# Patient Record
Sex: Female | Born: 1955 | Race: White | Hispanic: No | Marital: Married | Smoking: Never smoker
Health system: Southern US, Community
[De-identification: ages and names within clinical notes are randomized; demographics above are authoritative.]

## PROBLEM LIST (undated history)

## (undated) DIAGNOSIS — IMO0002 Reserved for concepts with insufficient information to code with codable children: Secondary | ICD-10-CM

## (undated) DIAGNOSIS — Z5189 Encounter for other specified aftercare: Secondary | ICD-10-CM

## (undated) DIAGNOSIS — M1990 Unspecified osteoarthritis, unspecified site: Secondary | ICD-10-CM

## (undated) DIAGNOSIS — I10 Essential (primary) hypertension: Secondary | ICD-10-CM

## (undated) DIAGNOSIS — E119 Type 2 diabetes mellitus without complications: Secondary | ICD-10-CM

## (undated) HISTORY — DX: Encounter for other specified aftercare: Z51.89

## (undated) HISTORY — DX: Reserved for concepts with insufficient information to code with codable children: IMO0002

## (undated) HISTORY — DX: Type 2 diabetes mellitus without complications: E11.9

## (undated) HISTORY — DX: Unspecified osteoarthritis, unspecified site: M19.90

## (undated) HISTORY — DX: Essential (primary) hypertension: I10

---

## 2014-02-03 ENCOUNTER — Encounter: Payer: Self-pay | Admitting: Podiatrist

## 2014-02-03 ENCOUNTER — Ambulatory Visit (INDEPENDENT_AMBULATORY_CARE_PROVIDER_SITE_OTHER): Payer: BC Managed Care – PPO | Admitting: Podiatrist

## 2014-02-03 VITALS — BP 117/69 | HR 66 | Resp 18

## 2014-02-03 DIAGNOSIS — L600 Ingrowing nail: Secondary | ICD-10-CM

## 2014-02-03 NOTE — Progress Notes (Signed)
   Subjective:    Patient ID: Hailey FilbertJudy Beauchesne, female    DOB: 01-21-1956, 58 y.o.   MRN: [id number]  HPI 2nd toe on my left foot has been going on for about 2 months and the ingrown has been going on for a year and doesn't hurt all the time and the sore did hurt when I did it and no draining and can be sore and tender and hurts with closed toe shoes    Review of Systems  Hematological: Bruises/bleeds easily.  All other systems reviewed and are negative.      Objective:   Physical Exam  Patient is awake, alert, and oriented x 3.  In no acute distress.  Vascular status is intact with palpable pedal pulses at 2/4 DP and PT bilateral and capillary refill time within normal limits. Neurological sensation is also intact bilaterally via Semmes Weinstein monofilament at 5/5 sites. Light touch, vibratory sensation, Achilles tendon reflex is intact. Dermatological exam reveals skin color, turger and texture as normal. No open lesions present.  Musculature intact with dorsiflexion, plantarflexion, inversion, eversion.  Left 2nd toe has an area of dried hemorrhagic tissue at the distal lateral tip-- non infected and not draining.  The lateral side of the left 2nd toenail is ingrown and causing hypertrophy of the lateral nail fold.  No redness, swelling or drainage is present but pain is noted subjectively.    Assessment & Plan:  Ingrown left 2nd toenail lateral side, hematoma tip of left 2nd toe  Plan:Treatment options and alternatives discussed.  Recommended permanent phenol matrixectomy and patient agreed.  Left 2nd toe was prepped with alcohol and a 1 to 1 mix of 0.5% marcaine plain and 2% lidocaine plain was administered in a digital block fashion.  The toe was then prepped with betadine solution and exsanguinated.  The lateral nail border was then excised and matrix tissue exposed.  Phenol was then applied to the matrix tissue followed by an alcohol wash.  Antibiotic ointment and a dry sterile dressing  was applied.  The patient was dispensed instructions for aftercare.

## 2014-02-03 NOTE — Patient Instructions (Signed)

## 2014-04-21 ENCOUNTER — Ambulatory Visit: Payer: BC Managed Care – PPO | Admitting: Podiatrist

## 2014-07-07 ENCOUNTER — Ambulatory Visit (INDEPENDENT_AMBULATORY_CARE_PROVIDER_SITE_OTHER): Payer: BC Managed Care – PPO | Admitting: Podiatrist

## 2014-07-07 ENCOUNTER — Ambulatory Visit (INDEPENDENT_AMBULATORY_CARE_PROVIDER_SITE_OTHER): Payer: BC Managed Care – PPO

## 2014-07-07 DIAGNOSIS — R52 Pain, unspecified: Secondary | ICD-10-CM

## 2014-07-07 DIAGNOSIS — S93601A Unspecified sprain of right foot, initial encounter: Secondary | ICD-10-CM

## 2014-07-07 MED ORDER — PREDNISONE 10 MG PO KIT: PACK | ORAL | Status: DC

## 2014-07-07 NOTE — Patient Instructions (Signed)
Foot Sprain The muscles and cord like structures which attach muscle to bone (tendons) that surround the feet are made up of units. A foot sprain can occur at the weakest spot in any of these units. This condition is most often caused by injury to or overuse of the foot, as from playing contact sports, or aggravating a previous injury, or from poor conditioning, or obesity. SYMPTOMS  Pain with movement of the foot.  Tenderness and swelling at the injury site.  Loss of strength is present in moderate or severe sprains. THE THREE GRADES OR SEVERITY OF FOOT SPRAIN ARE:  Mild (Grade I): Slightly pulled muscle without tearing of muscle or tendon fibers or loss of strength.  Moderate (Grade II): Tearing of fibers in a muscle, tendon, or at the attachment to bone, with small decrease in strength.  Severe (Grade III): Rupture of the muscle-tendon-bone attachment, with separation of fibers. Severe sprain requires surgical repair. Often repeating (chronic) sprains are caused by overuse. Sudden (acute) sprains are caused by direct injury or over-use. DIAGNOSIS  Diagnosis of this condition is usually by your own observation. If problems continue, a caregiver may be required for further evaluation and treatment. X-rays may be required to make sure there are not breaks in the bones (fractures) present. Continued problems may require physical therapy for treatment. PREVENTION  Use strength and conditioning exercises appropriate for your sport.  Warm up properly prior to working out.  Use athletic shoes that are made for the sport you are participating in.  Allow adequate time for healing. Early return to activities makes repeat injury more likely, and can lead to an unstable arthritic foot that can result in prolonged disability. Mild sprains generally heal in 3 to 10 days, with moderate and severe sprains taking 2 to 10 weeks. Your caregiver can help you determine the proper time required for  healing. HOME CARE INSTRUCTIONS   Apply ice to the injury for 15-20 minutes, 03-04 times per day. Put the ice in a plastic bag and place a towel between the bag of ice and your skin.  An elastic wrap (like an Ace bandage) may be used to keep swelling down.  Keep foot above the level of the heart, or at least raised on a footstool, when swelling and pain are present.  Try to avoid use other than gentle range of motion while the foot is painful. Do not resume use until instructed by your caregiver. Then begin use gradually, not increasing use to the point of pain. If pain does develop, decrease use and continue the above measures, gradually increasing activities that do not cause discomfort, until you gradually achieve normal use.  Use crutches if and as instructed, and for the length of time instructed.  Keep injured foot and ankle wrapped between treatments.  Massage foot and ankle for comfort and to keep swelling down. Massage from the toes up towards the knee.  Only take over-the-counter or prescription medicines for pain, discomfort, or fever as directed by your caregiver. SEEK IMMEDIATE MEDICAL CARE IF:   Your pain and swelling increase, or pain is not controlled with medications.  You have loss of feeling in your foot or your foot turns cold or blue.  You develop new, unexplained symptoms, or an increase of the symptoms that brought you to your caregiver. MAKE SURE YOU:   Understand these instructions.  Will watch your condition.  Will get help right away if you are not doing well or get worse. Document Released:   02/24/2002 Document Revised: 11/27/2011 Document Reviewed: 04/23/2008 Exit[employer] Patient Information 2015 [employer]. This information is not intended to replace advice given to you by your health care provider. Make sure you discuss any questions you have with your health care provider.  

## 2014-07-07 NOTE — Progress Notes (Signed)
   Subjective:    Patient ID: Hailey Espinoza, female    DOB: 1955-11-03, 58 y.o.   MRN: [id number]  HPI PT STATED INJURED RT TOP AND SIDE OF THE LATERAL SIDE OF THE FOOT IS BEEN SORE FOR 2 WEEKS. THE FOOT IS BEEN THE SAME BUT LAST NIGHT IS THROBBING. THE FOOT GET AGGRAVATED WEARING CLOSED SHOES. TRIED ICE  AND SOAKING WITH EPSON SALT AND IT HELPS.   Review of Systems  All other systems reviewed and are negative.      Objective:   Physical Exam Patient is awake, alert, and oriented x 3.  In no acute distress.  Vascular status is intact with palpable pedal pulses at 2/4 DP and PT bilateral and capillary refill time within normal limits. Neurological sensation is also intact bilaterally via Semmes Weinstein monofilament at 5/5 sites. Light touch, vibratory sensation, Achilles tendon reflex is intact. Dermatological exam reveals skin color, turger and texture as normal. No open lesions present.  Musculature intact with dorsiflexion, plantarflexion, inversion, eversion.  Discomfort on the dorsal lateral aspect of the right foot is present.  No swelling, no ecchymosis is present. No pain with palpation of the tuning fork.   Mild pain with direct pressure dorsal lateral foot is present. xrays are negative for fracture. No sign of dislocation is present.  Screw in the fifth metatarsal head from a previous surgery is in good aligment and position.      Assessment & Plan:  Foot sprain right  Plan:  Recommended a compessive anklet which was dispensed without complication.  Recommended continued ice, antinflammatory medication and rest.  She will be seen back if the foot does not improve however this should be a self limiting condition.

## 2015-09-02 ENCOUNTER — Ambulatory Visit (INDEPENDENT_AMBULATORY_CARE_PROVIDER_SITE_OTHER): Payer: BC Managed Care – PPO | Admitting: Sports Medicine

## 2015-09-02 ENCOUNTER — Encounter: Payer: Self-pay | Admitting: Sports Medicine

## 2015-09-02 DIAGNOSIS — M79671 Pain in right foot: Secondary | ICD-10-CM

## 2015-09-02 DIAGNOSIS — M779 Enthesopathy, unspecified: Secondary | ICD-10-CM | POA: Diagnosis not present

## 2015-09-02 MED ORDER — TRIAMCINOLONE ACETONIDE 10 MG/ML IJ SUSP: 10.0000 mg | Freq: Once | INTRAMUSCULAR | Status: AC

## 2015-09-02 NOTE — Progress Notes (Signed)
Patient ID: Hailey Espinoza, female   DOB: 1956/01/02, 59 y.o.   MRN: [id number] Subjective: Brandi Tomlinson is a 59 y.o. female patient who returns to office for evaluation of Right foot pain. Patient complains of progressive pain especially over the last 2 weeks in the Right foot at the dorsal aspect same as previous. Reports that she did some walking on threadmill that may have contributed to her foot hurting. Tried ice and soaks which help. Patient denies any other pedal complaints. Denies any other causative factors.   There are no active problems to display for this patient.  Current Outpatient Prescriptions on File Prior to Visit  Medication Sig Dispense Refill  . Biotin 7500 MCG TABS Take by mouth.    . calcium carbonate 200 MG capsule Take 250 mg by mouth 2 (two) times daily with a meal.    . Cholecalciferol (VITAMIN D3) 2000 UNITS capsule Take 2,000 Units by mouth daily.    . Coenzyme Q10 (CO Q 10) 100 MG CAPS Take 200 mg by mouth.    . diltiazem (DILACOR XR) 120 MG 24 hr capsule Take 120 mg by mouth daily. Patient not sure of the mg/lc    . ferrous sulfate 325 (65 FE) MG tablet Take 30 mg by mouth daily with breakfast.    . losartan (COZAAR) 25 MG tablet Take 25 mg by mouth daily.    . metFORMIN (GLUCOPHAGE) 500 MG tablet Take by mouth 2 (two) times daily with a meal.    . metroNIDAZOLE (FLAGYL) 500 MG tablet Take 500 mg by mouth 3 (three) times daily.    . Multiple Vitamin (MULTIVITAMIN) tablet Take 1 tablet by mouth daily.    Marland Kitchen omega-3 acid ethyl esters (LOVAZA) 1 G capsule Take 1,000 mg by mouth 2 (two) times daily.    Marland Kitchen omeprazole (PRILOSEC) 20 MG capsule Take 20 mg by mouth daily.    Marland Kitchen oxybutynin (DITROPAN) 5 MG tablet Take 5 mg by mouth 3 (three) times daily.    . polyethylene glycol (MIRALAX / GLYCOLAX) packet Take 17 g by mouth daily.    . PredniSONE 10 MG KIT 6 day tapering dose 1 kit 0  . vitamin B-12 (CYANOCOBALAMIN) 500 MCG tablet Take 500 mcg by mouth daily.     No current  facility-administered medications on file prior to visit.   No Known Allergies   Objective:  General: Alert and oriented x3 in no acute distress  Dermatology: No open lesions bilateral lower extremities, no webspace macerations, no ecchymosis bilateral, all nails x 10 are well manicured.  Vascular: Dorsalis Pedis and Posterior Tibial pedal pulses 2/4, Capillary Fill Time 3 seconds,(+) pedal hair growth bilateral, no edema bilateral lower extremities, Temperature gradient within normal limits.  Neurology: Gross sensation intact via light touch bilateral, Protective sensation intact  with Semmes Weinstein Monofilament to all pedal sites, Position sense intact, vibratory intact bilateral, Deep tendon reflexes within normal limits bilateral, No babinski sign present bilateral. (+/- )Tinels sign right/left foot.   Musculoskeletal: Mild tenderness with palpation at dorsal aspect of 3-5 MTPJs on Right, no pain with tuning fork to this area,No pain with calf compression bilateral. ROM within normal limits with mild guarding on right at extension. Pes Planus foot type bilateral. Strength within normal limits in all groups bilateral.   Assessment and Plan: Problem List Items Addressed This Visit    None    Visit Diagnoses    Right foot pain    -  Primary    Relevant  Medications    triamcinolone acetonide (KENALOG) 10 MG/ML injection 10 mg    Tendonitis        Relevant Medications    triamcinolone acetonide (KENALOG) 10 MG/ML injection 10 mg    Capsulitis        Relevant Medications    triamcinolone acetonide (KENALOG) 10 MG/ML injection 10 mg       -Complete examination performed -Previous Xrays reviewed; if continues to be symptomatic will order a new set of xrays -Discussed treatement options -To dorsum of right foot at the 3-5th MTPJs injected 3cc mixture of lidocaine, marcaine, dexamethasone phosphate, and kenalog 10 without complication. Applied compressive coban dressing; post  injection care discussed -No oral meds/antiinflammatories given due to patient GI history/bypass -Recommend patient to wear CAM boot of which she already owns for the next 3 days until symptoms improve and then transition back to good supportive shoe -Recommend icing and elevating daily until symptoms improve -Patient to return to office in 3 weeks or sooner if condition worsens. Will consider orthotic management long term at next visit.   Landis Martins, DPM

## 2015-09-23 ENCOUNTER — Ambulatory Visit (INDEPENDENT_AMBULATORY_CARE_PROVIDER_SITE_OTHER): Payer: BC Managed Care – PPO | Admitting: Sports Medicine

## 2015-09-23 ENCOUNTER — Encounter: Payer: Self-pay | Admitting: Sports Medicine

## 2015-09-23 DIAGNOSIS — M779 Enthesopathy, unspecified: Secondary | ICD-10-CM | POA: Diagnosis not present

## 2015-09-23 DIAGNOSIS — M79671 Pain in right foot: Secondary | ICD-10-CM | POA: Diagnosis not present

## 2015-09-23 DIAGNOSIS — M2141 Flat foot [pes planus] (acquired), right foot: Secondary | ICD-10-CM

## 2015-09-23 DIAGNOSIS — M2142 Flat foot [pes planus] (acquired), left foot: Secondary | ICD-10-CM | POA: Diagnosis not present

## 2015-09-23 DIAGNOSIS — M722 Plantar fascial fibromatosis: Secondary | ICD-10-CM | POA: Diagnosis not present

## 2015-09-23 NOTE — Progress Notes (Signed)
Patient ID: Aislyn Hayse, female   DOB: April 08, 1956, 61 y.o.   MRN: [id number]  Subjective: Sanja Elizardo is a 60 y.o. female patient who returns to office for evaluation of Right foot pain. Patient states that pain is gone and has not had any recurrence; tolerated the CAM boot for a few days well and iced and elevated with no problems. Patient denies any other pedal complaints. Denies any other causative factors.   There are no active problems to display for this patient.  Current Outpatient Prescriptions on File Prior to Visit  Medication Sig Dispense Refill  . Biotin 7500 MCG TABS Take by mouth.    . calcium carbonate 200 MG capsule Take 250 mg by mouth 2 (two) times daily with a meal.    . Cholecalciferol (VITAMIN D3) 2000 UNITS capsule Take 2,000 Units by mouth daily.    . Coenzyme Q10 (CO Q 10) 100 MG CAPS Take 200 mg by mouth.    . diltiazem (DILACOR XR) 120 MG 24 hr capsule Take 120 mg by mouth daily. Patient not sure of the mg/lc    . ferrous sulfate 325 (65 FE) MG tablet Take 30 mg by mouth daily with breakfast.    . losartan (COZAAR) 25 MG tablet Take 25 mg by mouth daily.    . metFORMIN (GLUCOPHAGE) 500 MG tablet Take by mouth 2 (two) times daily with a meal.    . metroNIDAZOLE (FLAGYL) 500 MG tablet Take 500 mg by mouth 3 (three) times daily.    . Multiple Vitamin (MULTIVITAMIN) tablet Take 1 tablet by mouth daily.    Marland Kitchen omega-3 acid ethyl esters (LOVAZA) 1 G capsule Take 1,000 mg by mouth 2 (two) times daily.    Marland Kitchen omeprazole (PRILOSEC) 20 MG capsule Take 20 mg by mouth daily.    Marland Kitchen oxybutynin (DITROPAN) 5 MG tablet Take 5 mg by mouth 3 (three) times daily.    . polyethylene glycol (MIRALAX / GLYCOLAX) packet Take 17 g by mouth daily.    . PredniSONE 10 MG KIT 6 day tapering dose 1 kit 0  . vitamin B-12 (CYANOCOBALAMIN) 500 MCG tablet Take 500 mcg by mouth daily.     Current Facility-Administered Medications on File Prior to Visit  Medication Dose Route Frequency Provider Last Rate  Last Dose  . triamcinolone acetonide (KENALOG) 10 MG/ML injection 10 mg  10 mg Other Once [employer], DPM       No Known Allergies   Objective:  General: Alert and oriented x3 in no acute distress  Dermatology: No open lesions bilateral lower extremities, no webspace macerations, mild ecchymosis at right dorsal foot injection site with no signs of infection, all nails x 10 are well manicured.  Vascular: Dorsalis Pedis and Posterior Tibial pedal pulses 2/4, Capillary Fill Time 3 seconds,(+) pedal hair growth bilateral, no edema bilateral lower extremities, Temperature gradient within normal limits.  Neurology: Gross sensation intact via light touch bilateral.  Musculoskeletal: No tenderness with palpation at dorsal aspect of 3-5 MTPJs on Right, no pain with tuning fork to this area, No pain to plantar fascia. No pain with calf compression bilateral. ROM within normal limits. Pes Planus foot type bilateral. Strength within normal limits in all groups bilateral.   Assessment and Plan: Problem List Items Addressed This Visit    None    Visit Diagnoses    Right foot pain    -  Primary    resolved    Tendonitis  resolved    Capsulitis        resolved    Pes planus of both feet        Bilateral plantar fasciitis        history, remains resolved       -Complete examination performed -Discussed long term care  -Recommend custom functional foot orthotics; Patient agreeable and was scanned at this encounter; prescription sent to [place] patient to monitor right foot closely for reoccurrence of symptoms. If occurs to ice, elevate, and return to office for repeat eval.  -Patient to return to office in PUO or sooner if condition worsens.   Landis Martins, DPM

## 2015-10-21 ENCOUNTER — Ambulatory Visit (INDEPENDENT_AMBULATORY_CARE_PROVIDER_SITE_OTHER): Payer: BC Managed Care – PPO | Admitting: Sports Medicine

## 2015-10-21 ENCOUNTER — Encounter: Payer: Self-pay | Admitting: Sports Medicine

## 2015-10-21 DIAGNOSIS — M722 Plantar fascial fibromatosis: Secondary | ICD-10-CM | POA: Diagnosis not present

## 2015-10-21 DIAGNOSIS — M2141 Flat foot [pes planus] (acquired), right foot: Secondary | ICD-10-CM | POA: Diagnosis not present

## 2015-10-21 DIAGNOSIS — M779 Enthesopathy, unspecified: Secondary | ICD-10-CM | POA: Diagnosis not present

## 2015-10-21 DIAGNOSIS — M79671 Pain in right foot: Secondary | ICD-10-CM

## 2015-10-21 DIAGNOSIS — M2142 Flat foot [pes planus] (acquired), left foot: Secondary | ICD-10-CM

## 2015-10-21 NOTE — Progress Notes (Signed)
Patient ID: Hailey Espinoza, female   DOB: June 15, 1956, 60 y.o.   MRN: [id number]  Subjective: Hailey Espinoza is a 60 y.o. female patient who returns to office for pick up orthotics. Patient denies any other pedal complaints at this time.  There are no active problems to display for this patient.  Current Outpatient Prescriptions on File Prior to Visit  Medication Sig Dispense Refill  . Biotin 7500 MCG TABS Take by mouth.    . calcium carbonate 200 MG capsule Take 250 mg by mouth 2 (two) times daily with a meal.    . Cholecalciferol (VITAMIN D3) 2000 UNITS capsule Take 2,000 Units by mouth daily.    . Coenzyme Q10 (CO Q 10) 100 MG CAPS Take 200 mg by mouth.    . diltiazem (DILACOR XR) 120 MG 24 hr capsule Take 120 mg by mouth daily. Patient not sure of the mg/lc    . ferrous sulfate 325 (65 FE) MG tablet Take 30 mg by mouth daily with breakfast.    . losartan (COZAAR) 25 MG tablet Take 25 mg by mouth daily.    . metFORMIN (GLUCOPHAGE) 500 MG tablet Take by mouth 2 (two) times daily with a meal.    . metroNIDAZOLE (FLAGYL) 500 MG tablet Take 500 mg by mouth 3 (three) times daily.    . Multiple Vitamin (MULTIVITAMIN) tablet Take 1 tablet by mouth daily.    Marland Kitchen omega-3 acid ethyl esters (LOVAZA) 1 G capsule Take 1,000 mg by mouth 2 (two) times daily.    Marland Kitchen omeprazole (PRILOSEC) 20 MG capsule Take 20 mg by mouth daily.    Marland Kitchen oxybutynin (DITROPAN) 5 MG tablet Take 5 mg by mouth 3 (three) times daily.    . polyethylene glycol (MIRALAX / GLYCOLAX) packet Take 17 g by mouth daily.    . PredniSONE 10 MG KIT 6 day tapering dose 1 kit 0  . vitamin B-12 (CYANOCOBALAMIN) 500 MCG tablet Take 500 mcg by mouth daily.     Current Facility-Administered Medications on File Prior to Visit  Medication Dose Route Frequency Provider Last Rate Last Dose  . triamcinolone acetonide (KENALOG) 10 MG/ML injection 10 mg  10 mg Other Once [employer], DPM       No Known Allergies   Objective:  No acute changes in  exam  General: Alert and oriented x3 in no acute distress  Dermatology: No open lesions bilateral lower extremities, no webspace macerations, resolved ecchymosis at right dorsal foot injection site with no signs of infection, all nails x 10 are well manicured.  Vascular: Dorsalis Pedis and Posterior Tibial pedal pulses 2/4, Capillary Fill Time 3 seconds,(+) pedal hair growth bilateral, no edema bilateral lower extremities, Temperature gradient within normal limits.  Neurology: Gross sensation intact via light touch bilateral.  Musculoskeletal: No tenderness with palpation at dorsal aspect of 3-5 MTPJs on Right, no pain with tuning fork to this area, No pain to plantar fascia. No pain with calf compression bilateral. ROM within normal limits. Pes Planus foot type bilateral. Strength within normal limits in all groups bilateral.   Gait: Supported with new orthotics in brooks sneakers.  Assessment and Plan: Problem List Items Addressed This Visit    None    Visit Diagnoses    Right foot pain    -  Primary    Tendonitis        Capsulitis        Pes planus of both feet        Bilateral plantar  fasciitis           -Complete examination performed -Dispensed orthotics to patient with break in period explained. Advised patient to call if there are any issues with new orthotics. -Patient to return to office as needed or sooner if condition worsens.   Landis Martins, DPM

## 2015-10-21 NOTE — Patient Instructions (Signed)

## 2020-04-06 ENCOUNTER — Telehealth: Payer: Self-pay

## 2020-04-06 ENCOUNTER — Encounter: Payer: Self-pay | Admitting: Internal Medicine

## 2020-04-06 ENCOUNTER — Other Ambulatory Visit: Payer: Self-pay

## 2020-04-06 ENCOUNTER — Ambulatory Visit: Payer: BC Managed Care – PPO | Admitting: Internal Medicine

## 2020-04-06 ENCOUNTER — Institutional Professional Consult (permissible substitution): Payer: Self-pay | Admitting: Internal Medicine

## 2020-04-06 VITALS — BP 133/83 | HR 69 | Temp 97.3°F | Ht 65.0 in | Wt 193.0 lb

## 2020-04-06 DIAGNOSIS — T24209A Burn of second degree of unspecified site of unspecified lower limb, except ankle and foot, initial encounter: Secondary | ICD-10-CM

## 2020-04-06 MED ORDER — COLLAGENASE 250 UNIT/GM EX OINT: 1.0000 "application " | TOPICAL_OINTMENT | Freq: Every day | CUTANEOUS | 0 refills | Status: DC

## 2020-04-06 NOTE — Patient Instructions (Signed)
Hailey Espinoza It was a pleasure meeting you today.  Please follow the instructions below for your wound care  By the time you receive supplies you can continue using silvadene daily with dressing changes  Once you obtain santyl ointment you can stop the silvadene.  If you only have santyl place it on the wound followed by a non stick pad and wrap with gauze  Once you obtain xeroform and santyl follow the instructions below  1) Place santyl on the wound covered by the xeroform dressing  2) followed by non stick pad 3) wrap with gauze 4) cover with ace bandage   Please follow up with me in 1 week.  Call us at [phone] with any questions or concerns  [facility] is a medical supply company.  We have sent them an order for your supplies.  Please contact them if you do not receive your supplies in the next 72hrs.  Their number is [phone] and website is [email or web address]
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
## 2020-04-06 NOTE — Telephone Encounter (Signed)
Orders faxed to [facility] per Dr. Mikey Bussing for supplies for daily dressing changes. Also RX-for Santyl forwarded to pt's pharmacy by Dr. Mikey Bussing

## 2020-04-12 ENCOUNTER — Telehealth: Payer: Self-pay | Admitting: Internal Medicine

## 2020-04-12 NOTE — Telephone Encounter (Signed)
Patient called to find out how much she should be ordering from [facility] because she is new to this process. Please call to advise patient on how to ordering supplies.
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
## 2020-04-12 NOTE — Telephone Encounter (Signed)
Called and [facility] @ 9:22am) asking the patient to return regarding message below.//AB/CMA

## 2020-04-15 ENCOUNTER — Encounter: Payer: Self-pay | Admitting: Internal Medicine

## 2020-04-15 ENCOUNTER — Ambulatory Visit: Payer: BC Managed Care – PPO | Admitting: Internal Medicine

## 2020-04-15 ENCOUNTER — Other Ambulatory Visit: Payer: Self-pay

## 2020-04-15 VITALS — BP 129/68 | HR 73 | Temp 98.3°F

## 2020-04-15 DIAGNOSIS — T24209A Burn of second degree of unspecified site of unspecified lower limb, except ankle and foot, initial encounter: Secondary | ICD-10-CM | POA: Diagnosis not present
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
## 2020-04-27 ENCOUNTER — Other Ambulatory Visit: Payer: Self-pay

## 2020-04-27 ENCOUNTER — Ambulatory Visit: Payer: BC Managed Care – PPO | Admitting: Internal Medicine

## 2020-04-27 ENCOUNTER — Encounter: Payer: Self-pay | Admitting: Internal Medicine

## 2020-04-27 VITALS — BP 143/70 | HR 73 | Temp 98.4°F

## 2020-04-27 DIAGNOSIS — T24209A Burn of second degree of unspecified site of unspecified lower limb, except ankle and foot, initial encounter: Secondary | ICD-10-CM | POA: Diagnosis not present

## 2020-04-27 MED ORDER — LIDOCAINE 5 % EX OINT: 1.0000 "application " | TOPICAL_OINTMENT | CUTANEOUS | 0 refills | Status: DC | PRN

## 2020-04-27 NOTE — Patient Instructions (Addendum)
Jai Bear It was a pleasure seeing you today.  Please follow the instructions below for your wound care  1) lidocaine cream on the wound bed 2) followed by vaseline 3) xeroform dressing 4) non stick pad 5) wrap with gauze 6) cover with ace bandage as needed   Please follow up with me in 1 week.  Call us at [phone] with any questions or concerns  You may take Tylenol to help with pain  [employer] is a medical supply company. Their number is [phone] and website is [email or web address]
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
## 2020-04-29 ENCOUNTER — Telehealth: Payer: Self-pay | Admitting: Internal Medicine

## 2020-04-29 ENCOUNTER — Ambulatory Visit: Payer: BC Managed Care – PPO | Admitting: Internal Medicine

## 2020-04-29 NOTE — Telephone Encounter (Signed)
Called patient to follow up on symptoms of stinging pain in the wound.  She states that she stopped the santyl and started the lidocaine cream.  She no longer has stinging pain but does feel tightness at the wound sites.  Overall there is some improvement with pain.  She has follow up on Monday.

## 2020-05-03 ENCOUNTER — Other Ambulatory Visit: Payer: Self-pay | Admitting: Physician Assistant

## 2020-05-03 ENCOUNTER — Other Ambulatory Visit: Payer: Self-pay | Admitting: Nurse Practitioner

## 2020-05-03 ENCOUNTER — Encounter: Payer: Self-pay | Admitting: Internal Medicine

## 2020-05-03 ENCOUNTER — Other Ambulatory Visit: Payer: Self-pay

## 2020-05-03 ENCOUNTER — Ambulatory Visit (INDEPENDENT_AMBULATORY_CARE_PROVIDER_SITE_OTHER): Payer: BC Managed Care – PPO | Admitting: Internal Medicine

## 2020-05-03 VITALS — BP 155/77 | HR 67 | Temp 98.2°F

## 2020-05-03 DIAGNOSIS — M25511 Pain in right shoulder: Secondary | ICD-10-CM

## 2020-05-03 DIAGNOSIS — T24209A Burn of second degree of unspecified site of unspecified lower limb, except ankle and foot, initial encounter: Secondary | ICD-10-CM

## 2020-05-03 MED ORDER — LIDOCAINE 5 % EX OINT
1.0000 | TOPICAL_OINTMENT | CUTANEOUS | 0 refills | Status: DC | PRN
Start: 2020-05-03 — End: 2022-02-06
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
## 2020-05-17 ENCOUNTER — Other Ambulatory Visit: Payer: Self-pay

## 2020-05-17 ENCOUNTER — Encounter: Payer: Self-pay | Admitting: Internal Medicine

## 2020-05-17 ENCOUNTER — Ambulatory Visit: Payer: BC Managed Care – PPO | Admitting: Internal Medicine

## 2020-05-17 VITALS — BP 142/88 | HR 65 | Temp 98.0°F

## 2020-05-17 DIAGNOSIS — T24209A Burn of second degree of unspecified site of unspecified lower limb, except ankle and foot, initial encounter: Secondary | ICD-10-CM
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
## 2020-05-23 ENCOUNTER — Other Ambulatory Visit: Payer: BC Managed Care – PPO

## 2020-06-02 ENCOUNTER — Ambulatory Visit: Payer: BC Managed Care – PPO | Admitting: Internal Medicine

## 2020-06-02 ENCOUNTER — Other Ambulatory Visit: Payer: Self-pay

## 2020-06-02 ENCOUNTER — Encounter: Payer: Self-pay | Admitting: Internal Medicine

## 2020-06-02 VITALS — BP 144/72 | HR 82 | Temp 97.6°F

## 2020-06-02 DIAGNOSIS — T24209A Burn of second degree of unspecified site of unspecified lower limb, except ankle and foot, initial encounter: Secondary | ICD-10-CM | POA: Diagnosis not present
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
## 2020-06-28 ENCOUNTER — Encounter: Payer: Self-pay | Admitting: Internal Medicine

## 2020-06-28 ENCOUNTER — Ambulatory Visit: Payer: BC Managed Care – PPO | Admitting: Internal Medicine

## 2020-06-28 ENCOUNTER — Other Ambulatory Visit: Payer: Self-pay

## 2020-06-28 VITALS — BP 134/88 | HR 68 | Temp 97.9°F

## 2020-06-28 DIAGNOSIS — T24209A Burn of second degree of unspecified site of unspecified lower limb, except ankle and foot, initial encounter: Secondary | ICD-10-CM | POA: Diagnosis not present
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
## 2020-07-19 ENCOUNTER — Ambulatory Visit: Payer: BC Managed Care – PPO | Admitting: Internal Medicine
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
## 2020-07-21 ENCOUNTER — Ambulatory Visit: Payer: BC Managed Care – PPO | Admitting: Plastic Surgery

## 2020-07-21 DIAGNOSIS — T24209A Burn of second degree of unspecified site of unspecified lower limb, except ankle and foot, initial encounter: Secondary | ICD-10-CM

## 2020-08-09 ENCOUNTER — Ambulatory Visit (INDEPENDENT_AMBULATORY_CARE_PROVIDER_SITE_OTHER): Payer: BC Managed Care – PPO | Admitting: Surgical

## 2020-08-09 ENCOUNTER — Encounter: Payer: Self-pay | Admitting: Surgical

## 2020-08-09 ENCOUNTER — Other Ambulatory Visit: Payer: Self-pay

## 2020-08-09 VITALS — BP 132/83 | HR 72 | Temp 98.0°F

## 2020-08-09 DIAGNOSIS — T24209A Burn of second degree of unspecified site of unspecified lower limb, except ankle and foot, initial encounter: Secondary | ICD-10-CM | POA: Diagnosis not present

## 2020-08-09 DIAGNOSIS — Z719 Counseling, unspecified: Secondary | ICD-10-CM
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
## 2020-09-20 DIAGNOSIS — Z719 Counseling, unspecified: Secondary | ICD-10-CM

## 2021-09-26 ENCOUNTER — Other Ambulatory Visit: Payer: Self-pay | Admitting: Internal Medicine

## 2021-09-26 DIAGNOSIS — Z8249 Family history of ischemic heart disease and other diseases of the circulatory system: Secondary | ICD-10-CM

## 2021-10-17 ENCOUNTER — Ambulatory Visit
Admission: RE | Admit: 2021-10-17 | Discharge: 2021-10-17 | Disposition: A | Discharge status: Home / Self Care | Payer: Self-pay | Source: Ambulatory Visit | Attending: Internal Medicine | Admitting: Internal Medicine

## 2021-10-17 DIAGNOSIS — Z8249 Family history of ischemic heart disease and other diseases of the circulatory system: Secondary | ICD-10-CM

## 2021-11-23 ENCOUNTER — Ambulatory Visit: Payer: Medicare PPO | Admitting: Podiatry

## 2021-11-23 ENCOUNTER — Other Ambulatory Visit: Payer: Self-pay

## 2021-11-23 ENCOUNTER — Encounter: Payer: Self-pay | Admitting: Podiatry

## 2021-11-23 ENCOUNTER — Ambulatory Visit (INDEPENDENT_AMBULATORY_CARE_PROVIDER_SITE_OTHER): Payer: Medicare PPO

## 2021-11-23 DIAGNOSIS — M7752 Other enthesopathy of left foot: Secondary | ICD-10-CM

## 2021-11-23 NOTE — Progress Notes (Signed)
?  Subjective:  ?Patient ID: Hailey Espinoza, female    DOB: [birth year],   MRN: [id number] ? ?Chief Complaint  ?Patient presents with  ? Foot Pain  ?  Left foot foot near pinky burning sensation nothing constant   ? ? ?66 y.o. female presents for left pinky toe burning pain. Relates this has been going on for months. Denies swelling and redness. Relates she had a pin placed in this toe years ago by Dr. Amalia Hailey and not sure if that is relates. Relates rest helps the burning sensation go away.  . Denies any other pedal complaints. Denies n/v/f/c.  ? ?Past Medical History:  ?Diagnosis Date  ? Arthritis   ? Blood transfusion without reported diagnosis   ? Diabetes mellitus without complication ([place])   ? Hypertension   ? Ulcer   ? ? ?Objective:  ?Physical Exam: ?Vascular: DP/PT pulses 2/4 bilateral. CFT <3 seconds. Normal hair growth on digits. No edema.  ?Skin. No lacerations or abrasions bilateral feet. Mild hyperkeratosis to lateral fifth metatarsal ?Musculoskeletal: MMT 5/5 bilateral lower extremities in DF, PF, Inversion and Eversion. Deceased ROM in DF of ankle joint. Some mild tenderness to left fifth MPJ.  ?Neurological: Sensation intact to light touch.  ? ?Assessment:  ? ?1. Capsulitis of toe of left foot   ? ? ? ?Plan:  ?Patient was evaluated and treated and all questions answered. ?Discussed capsulitis and inflammation of joint as well as corns and possible hardware irritation and neuropathy and treatment options with patient.  ?Radiographs reviewed and discussed with patient. Hardware intact.  ?Discussed padding and supportive shoes. Toe cap provided.  ?Discussed starting out with some topical creams like capsaicin cream to help with the nerve pain.  ?Patient to return in 8 weeks or sooner if concerns arise.  ? ? ?Lorenda Peck, DPM  ? ? ?

## 2022-01-17 DIAGNOSIS — S83241A Other tear of medial meniscus, current injury, right knee, initial encounter: Secondary | ICD-10-CM | POA: Diagnosis not present

## 2022-01-17 DIAGNOSIS — M94261 Chondromalacia, right knee: Secondary | ICD-10-CM | POA: Diagnosis not present

## 2022-01-17 DIAGNOSIS — M11261 Other chondrocalcinosis, right knee: Secondary | ICD-10-CM | POA: Diagnosis not present

## 2022-01-17 DIAGNOSIS — M84361A Stress fracture, right tibia, initial encounter for fracture: Secondary | ICD-10-CM | POA: Diagnosis not present

## 2022-01-17 DIAGNOSIS — G8918 Other acute postprocedural pain: Secondary | ICD-10-CM | POA: Diagnosis not present

## 2022-01-24 DIAGNOSIS — M6281 Muscle weakness (generalized): Secondary | ICD-10-CM | POA: Diagnosis not present

## 2022-01-24 DIAGNOSIS — M25461 Effusion, right knee: Secondary | ICD-10-CM | POA: Diagnosis not present

## 2022-01-24 DIAGNOSIS — M25661 Stiffness of right knee, not elsewhere classified: Secondary | ICD-10-CM | POA: Diagnosis not present

## 2022-01-24 DIAGNOSIS — R2689 Other abnormalities of gait and mobility: Secondary | ICD-10-CM | POA: Diagnosis not present

## 2022-01-24 DIAGNOSIS — M25561 Pain in right knee: Secondary | ICD-10-CM | POA: Diagnosis not present

## 2022-01-30 ENCOUNTER — Ambulatory Visit: Payer: Medicare PPO | Admitting: Podiatry

## 2022-02-02 DIAGNOSIS — M25561 Pain in right knee: Secondary | ICD-10-CM | POA: Diagnosis not present

## 2022-02-02 DIAGNOSIS — M25461 Effusion, right knee: Secondary | ICD-10-CM | POA: Diagnosis not present

## 2022-02-02 DIAGNOSIS — M25661 Stiffness of right knee, not elsewhere classified: Secondary | ICD-10-CM | POA: Diagnosis not present

## 2022-02-02 DIAGNOSIS — M6281 Muscle weakness (generalized): Secondary | ICD-10-CM | POA: Diagnosis not present

## 2022-02-02 DIAGNOSIS — R2689 Other abnormalities of gait and mobility: Secondary | ICD-10-CM | POA: Diagnosis not present

## 2022-02-06 ENCOUNTER — Ambulatory Visit (INDEPENDENT_AMBULATORY_CARE_PROVIDER_SITE_OTHER): Payer: Medicare PPO | Admitting: Urology

## 2022-02-06 ENCOUNTER — Encounter: Payer: Self-pay | Admitting: Urology

## 2022-02-06 VITALS — BP 149/82 | HR 75

## 2022-02-06 DIAGNOSIS — N3941 Urge incontinence: Secondary | ICD-10-CM | POA: Insufficient documentation

## 2022-02-06 LAB — URINALYSIS, ROUTINE W REFLEX MICROSCOPIC
Bilirubin, UA: NEGATIVE
Glucose, UA: NEGATIVE
Ketones, UA: NEGATIVE
Leukocytes,UA: NEGATIVE
Nitrite, UA: NEGATIVE
Protein,UA: NEGATIVE
RBC, UA: NEGATIVE
Specific Gravity, UA: >1.03 — ABNORMAL HIGH (ref 1.005–1.030)
Urobilinogen, Ur: 0.2 mg/dL (ref 0.2–1.0)
pH, UA: 5.5 (ref 5.0–7.5)

## 2022-02-06 LAB — BLADDER SCAN AMB NON-IMAGING: Scan Result: 63

## 2022-02-06 MED ORDER — SOLIFENACIN SUCCINATE 10 MG PO TABS: 10.0000 mg | ORAL_TABLET | Freq: Every day | ORAL | 3 refills | Status: DC

## 2022-02-06 NOTE — Progress Notes (Signed)
post void residual =63 

## 2022-02-06 NOTE — Progress Notes (Signed)
Assessment: 1. Urge incontinence      Plan: I personally reviewed the patient records from [facility]. Continue Vesicare 10 mg daily.  Refill sent. Return to office in 1 year.  Chief Complaint:  Chief Complaint  Patient presents with   Urinary Incontinence    History of Present Illness:  Hailey Espinoza is a 66 y.o. year old female who is seen for evaluation of urge incontinence.  She was previously followed in [facility] and was last seen in May 2022. She returns today for follow-up.  She continues on Vesicare 10 mg daily.  She reports that her urinary symptoms are well controlled.  No dysuria or gross hematuria.  Urologic History: She has a history of intermittent urinary incontinence for a number of years. Her symptoms include urgency with urge incontinence. She is status post a TVT-O urethral sling in April 2016. Her postoperative course was complicated by urinary retention as well as a pelvic fracture. She had a prolonged recovery due to the pelvic fracture. MRI of the pelvis showed fractures at the lateral aspect of the right superior pubic ramus and at the mid right inferior pubic ramus. She is currently voiding spontaneously. She continues to have nocturia 2, and urgency. She also has urge incontinence 3-4 times/day, requiring 2 poise pads per day (previously 4). She denies any stress incontinence. She previously tried oxybutynin but was unable to tolerate this due to constipation.    Urodynamics from 10/20/15 showed a large capacity, hyposensitive, non-compliant bladder with unstable bladder contractions with associated leakage. Cystoscopy from 11/01/15 showed no bladder abnormalities. She was started on PTNS on 11/09/15. She completed the initial 12 treatments and noted improvement in her nocturia, urgency, and urge incontinence. She also continued Myrbetriq 50 mg. She continued with the monthly PTNS maintenance treatments.  At the time of her visit in 2/18, her symptoms  had worsened with increased urgency and urge incontinence, and she was wearing 1-2 mini pads/day. She was placed on Vesicare instead of the Myrbetriq.  She noted excellent control of her urinary symptoms with the Vesicare. No side effects. Her last PTNS treatment was in June 2018.   At her visit in 5/22, she continued to do well on the Vesicare with good control of her bladder symptoms. She was not having any significant problems with urinary incontinence. No side effects. No dysuria or gross hematuria.    Past Medical History:  Past Medical History:  Diagnosis Date   Arthritis    Blood transfusion without reported diagnosis    Diabetes mellitus without complication (HCC)    Hypertension    Ulcer     Past Surgical History:  History reviewed. No pertinent surgical history.  Allergies:  No Known Allergies  Family History:  History reviewed. No pertinent family history.  Social History:  Social History   Tobacco Use   Smoking status: Never   Smokeless tobacco: Never  Substance Use Topics   Alcohol use: No   Drug use: No    Review of symptoms:  Constitutional:  Negative for unexplained weight loss, night sweats, fever, chills ENT:  Negative for nose bleeds, sinus pain, painful swallowing CV:  Negative for chest pain, shortness of breath, exercise intolerance, palpitations, loss of consciousness Resp:  Negative for cough, wheezing, shortness of breath GI:  Negative for nausea, vomiting, diarrhea, bloody stools GU:  Positives noted in HPI; otherwise negative for gross hematuria, dysuria Neuro:  Negative for seizures, poor balance, limb weakness, slurred speech Psych:  Negative  for lack of energy, depression, anxiety Endocrine:  Negative for polydipsia, polyuria, symptoms of hypoglycemia (dizziness, hunger, sweating) Hematologic:  Negative for anemia, purpura, petechia, prolonged or excessive bleeding, use of anticoagulants  Allergic:  Negative for difficulty breathing or  choking as a result of exposure to anything; no shellfish allergy; no allergic response (rash/itch) to materials, foods  Physical exam: BP (!) 149/82   Pulse 75  GENERAL APPEARANCE:  Well appearing, well developed, well nourished, NAD HEENT: Atraumatic, Normocephalic, oropharynx clear. NECK: Supple without lymphadenopathy or thyromegaly. LUNGS: Clear to auscultation bilaterally. HEART: Regular Rate and Rhythm without murmurs, gallops, or rubs. ABDOMEN: Soft, non-tender, No Masses. EXTREMITIES: Moves all extremities well.  Without clubbing, cyanosis, or edema. NEUROLOGIC:  Alert and oriented x 3, normal gait, CN II-XII grossly intact.  MENTAL STATUS:  Appropriate. BACK:  Non-tender to palpation.  No CVAT SKIN:  Warm, dry and intact.    Results: U/A dipstick: negative  Results for orders placed or performed in visit on 02/06/22 (from the past 24 hour(s))  Urinalysis, Routine w reflex microscopic   Collection Time: 02/06/22 10:15 AM  Result Value Ref Range   Specific Gravity, UA >1.030 (H) 1.005 - 1.030   pH, UA 5.5 5.0 - 7.5   Color, UA Yellow Yellow   Appearance Ur Clear Clear   Leukocytes,UA Negative Negative   Protein,UA Negative Negative/Trace   Glucose, UA Negative Negative   Ketones, UA Negative Negative   RBC, UA Negative Negative   Bilirubin, UA Negative Negative   Urobilinogen, Ur 0.2 0.2 - 1.0 mg/dL   Nitrite, UA Negative Negative   Microscopic Examination Comment   BLADDER SCAN AMB NON-IMAGING   Collection Time: 02/06/22 10:55 AM  Result Value Ref Range   Scan Result 63

## 2022-02-15 DIAGNOSIS — M6281 Muscle weakness (generalized): Secondary | ICD-10-CM | POA: Diagnosis not present

## 2022-02-15 DIAGNOSIS — M25661 Stiffness of right knee, not elsewhere classified: Secondary | ICD-10-CM | POA: Diagnosis not present

## 2022-02-15 DIAGNOSIS — I10 Essential (primary) hypertension: Secondary | ICD-10-CM | POA: Diagnosis not present

## 2022-02-15 DIAGNOSIS — M25461 Effusion, right knee: Secondary | ICD-10-CM | POA: Diagnosis not present

## 2022-02-15 DIAGNOSIS — M25561 Pain in right knee: Secondary | ICD-10-CM | POA: Diagnosis not present

## 2022-02-15 DIAGNOSIS — Z8249 Family history of ischemic heart disease and other diseases of the circulatory system: Secondary | ICD-10-CM | POA: Diagnosis not present

## 2022-02-15 DIAGNOSIS — R931 Abnormal findings on diagnostic imaging of heart and coronary circulation: Secondary | ICD-10-CM | POA: Diagnosis not present

## 2022-02-15 DIAGNOSIS — R2689 Other abnormalities of gait and mobility: Secondary | ICD-10-CM | POA: Diagnosis not present

## 2022-02-15 DIAGNOSIS — I358 Other nonrheumatic aortic valve disorders: Secondary | ICD-10-CM | POA: Diagnosis not present

## 2022-02-21 DIAGNOSIS — M25561 Pain in right knee: Secondary | ICD-10-CM | POA: Diagnosis not present

## 2022-02-21 DIAGNOSIS — M25661 Stiffness of right knee, not elsewhere classified: Secondary | ICD-10-CM | POA: Diagnosis not present

## 2022-02-21 DIAGNOSIS — M25461 Effusion, right knee: Secondary | ICD-10-CM | POA: Diagnosis not present

## 2022-02-21 DIAGNOSIS — R2689 Other abnormalities of gait and mobility: Secondary | ICD-10-CM | POA: Diagnosis not present

## 2022-02-21 DIAGNOSIS — M6281 Muscle weakness (generalized): Secondary | ICD-10-CM | POA: Diagnosis not present

## 2022-02-23 DIAGNOSIS — M25561 Pain in right knee: Secondary | ICD-10-CM | POA: Diagnosis not present

## 2022-02-23 DIAGNOSIS — M25461 Effusion, right knee: Secondary | ICD-10-CM | POA: Diagnosis not present

## 2022-02-23 DIAGNOSIS — M6281 Muscle weakness (generalized): Secondary | ICD-10-CM | POA: Diagnosis not present

## 2022-02-23 DIAGNOSIS — R2689 Other abnormalities of gait and mobility: Secondary | ICD-10-CM | POA: Diagnosis not present

## 2022-02-23 DIAGNOSIS — M25661 Stiffness of right knee, not elsewhere classified: Secondary | ICD-10-CM | POA: Diagnosis not present

## 2022-02-28 DIAGNOSIS — M6281 Muscle weakness (generalized): Secondary | ICD-10-CM | POA: Diagnosis not present

## 2022-02-28 DIAGNOSIS — M25561 Pain in right knee: Secondary | ICD-10-CM | POA: Diagnosis not present

## 2022-02-28 DIAGNOSIS — M25661 Stiffness of right knee, not elsewhere classified: Secondary | ICD-10-CM | POA: Diagnosis not present

## 2022-02-28 DIAGNOSIS — R2689 Other abnormalities of gait and mobility: Secondary | ICD-10-CM | POA: Diagnosis not present

## 2022-02-28 DIAGNOSIS — M25461 Effusion, right knee: Secondary | ICD-10-CM | POA: Diagnosis not present

## 2022-04-04 DIAGNOSIS — M25562 Pain in left knee: Secondary | ICD-10-CM | POA: Diagnosis not present

## 2022-04-04 DIAGNOSIS — Z6826 Body mass index (BMI) 26.0-26.9, adult: Secondary | ICD-10-CM | POA: Diagnosis not present

## 2022-04-04 DIAGNOSIS — E1169 Type 2 diabetes mellitus with other specified complication: Secondary | ICD-10-CM | POA: Diagnosis not present

## 2022-04-04 DIAGNOSIS — L309 Dermatitis, unspecified: Secondary | ICD-10-CM | POA: Diagnosis not present

## 2022-04-04 DIAGNOSIS — Z1231 Encounter for screening mammogram for malignant neoplasm of breast: Secondary | ICD-10-CM | POA: Diagnosis not present

## 2022-04-04 DIAGNOSIS — I10 Essential (primary) hypertension: Secondary | ICD-10-CM | POA: Diagnosis not present

## 2022-04-04 DIAGNOSIS — K219 Gastro-esophageal reflux disease without esophagitis: Secondary | ICD-10-CM | POA: Diagnosis not present

## 2022-04-04 DIAGNOSIS — Z79899 Other long term (current) drug therapy: Secondary | ICD-10-CM | POA: Diagnosis not present

## 2022-06-30 DIAGNOSIS — Z1231 Encounter for screening mammogram for malignant neoplasm of breast: Secondary | ICD-10-CM | POA: Diagnosis not present

## 2022-07-31 DIAGNOSIS — K5904 Chronic idiopathic constipation: Secondary | ICD-10-CM | POA: Diagnosis not present

## 2022-08-24 DIAGNOSIS — E1169 Type 2 diabetes mellitus with other specified complication: Secondary | ICD-10-CM | POA: Diagnosis not present

## 2022-08-24 DIAGNOSIS — Z79899 Other long term (current) drug therapy: Secondary | ICD-10-CM | POA: Diagnosis not present

## 2022-08-24 DIAGNOSIS — K219 Gastro-esophageal reflux disease without esophagitis: Secondary | ICD-10-CM | POA: Diagnosis not present

## 2022-08-24 DIAGNOSIS — Z0000 Encounter for general adult medical examination without abnormal findings: Secondary | ICD-10-CM | POA: Diagnosis not present

## 2022-08-24 DIAGNOSIS — Z1159 Encounter for screening for other viral diseases: Secondary | ICD-10-CM | POA: Diagnosis not present

## 2022-08-24 DIAGNOSIS — I10 Essential (primary) hypertension: Secondary | ICD-10-CM | POA: Diagnosis not present

## 2022-08-24 DIAGNOSIS — Z6826 Body mass index (BMI) 26.0-26.9, adult: Secondary | ICD-10-CM | POA: Diagnosis not present

## 2022-08-28 DIAGNOSIS — E1169 Type 2 diabetes mellitus with other specified complication: Secondary | ICD-10-CM | POA: Diagnosis not present

## 2022-08-28 DIAGNOSIS — I10 Essential (primary) hypertension: Secondary | ICD-10-CM | POA: Diagnosis not present

## 2022-10-18 DIAGNOSIS — L821 Other seborrheic keratosis: Secondary | ICD-10-CM | POA: Diagnosis not present

## 2022-10-18 DIAGNOSIS — K130 Diseases of lips: Secondary | ICD-10-CM | POA: Diagnosis not present

## 2022-11-02 DIAGNOSIS — K5904 Chronic idiopathic constipation: Secondary | ICD-10-CM | POA: Diagnosis not present

## 2022-11-22 DIAGNOSIS — Z01419 Encounter for gynecological examination (general) (routine) without abnormal findings: Secondary | ICD-10-CM | POA: Diagnosis not present

## 2022-11-22 DIAGNOSIS — L900 Lichen sclerosus et atrophicus: Secondary | ICD-10-CM | POA: Diagnosis not present

## 2022-11-22 DIAGNOSIS — Z1330 Encounter for screening examination for mental health and behavioral disorders, unspecified: Secondary | ICD-10-CM | POA: Diagnosis not present

## 2023-01-22 DIAGNOSIS — K5904 Chronic idiopathic constipation: Secondary | ICD-10-CM | POA: Diagnosis not present

## 2023-01-22 DIAGNOSIS — R195 Other fecal abnormalities: Secondary | ICD-10-CM | POA: Diagnosis not present

## 2023-01-22 DIAGNOSIS — R600 Localized edema: Secondary | ICD-10-CM | POA: Diagnosis not present

## 2023-01-22 DIAGNOSIS — R197 Diarrhea, unspecified: Secondary | ICD-10-CM | POA: Diagnosis not present

## 2023-02-11 IMAGING — CT CT CARDIAC CORONARY ARTERY CALCIUM SCORE
3 series · 14 of 20 positions shown, 16 images · non-contrast
Comparison: None.

CLINICAL DATA: 66-year-old Caucasian female with history of
hypertension, diabetes and family history of heart disease.

EXAM:
CT CARDIAC CORONARY ARTERY CALCIUM SCORE
TECHNIQUE: Non-contrast imaging through the heart was performed using
prospective ECG gating. Image post processing was performed on an
independent workstation, allowing for quantitative analysis of the
heart and coronary arteries. Note that this exam targets the heart
and the chest was not imaged in its entirety.

[Series 2: calcium scoring 2.00 qr36 bestdiast 71% hrt calciu · axial · 0.39mm/px · z∈[+1597,+1679]mm · 4 of 69 slices shown]
[im 14/69  vessel]
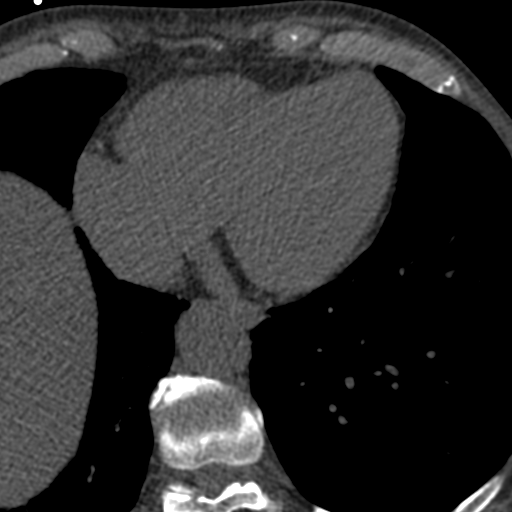
[im 28/69  vessel]
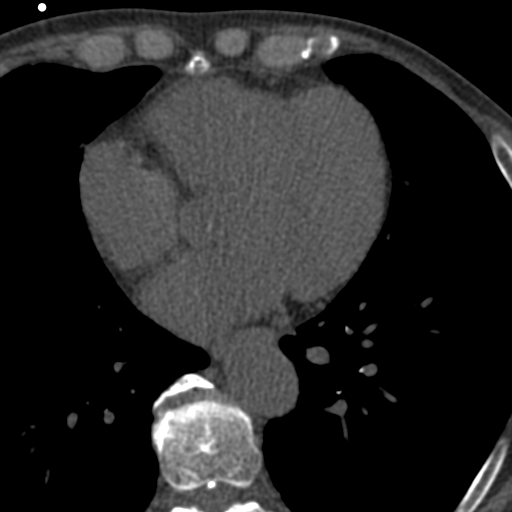
[im 41/69  vessel]
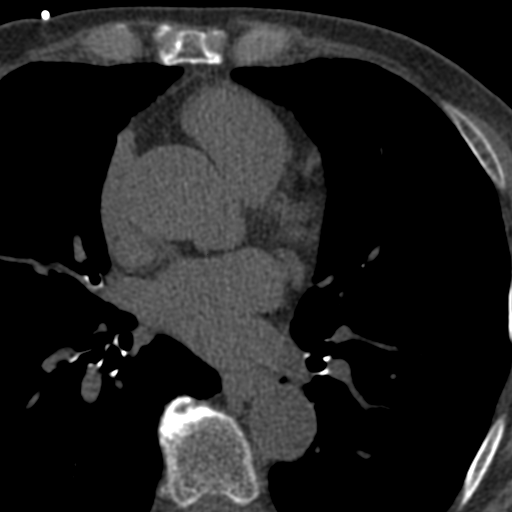
[im 55/69  vessel]
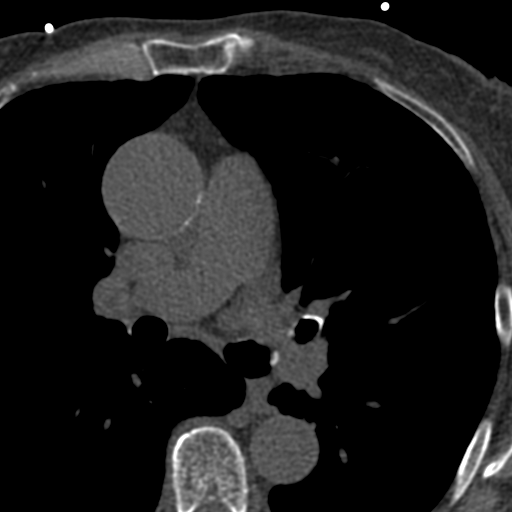

[Series 3: calcium scoring 2.00 br40 bestdiast 71% axial · axial · 0.51mm/px · z∈[+1575,+1679]mm · 5 of 80 slices shown, 7 images]
[im 14/80  vessel]
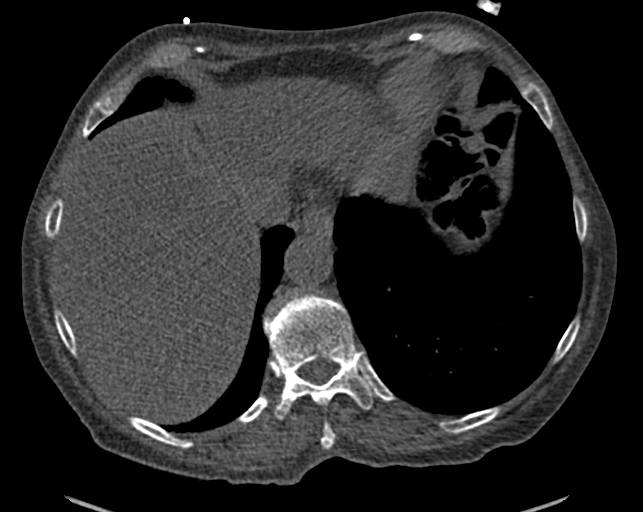
[im 14/80  lung]
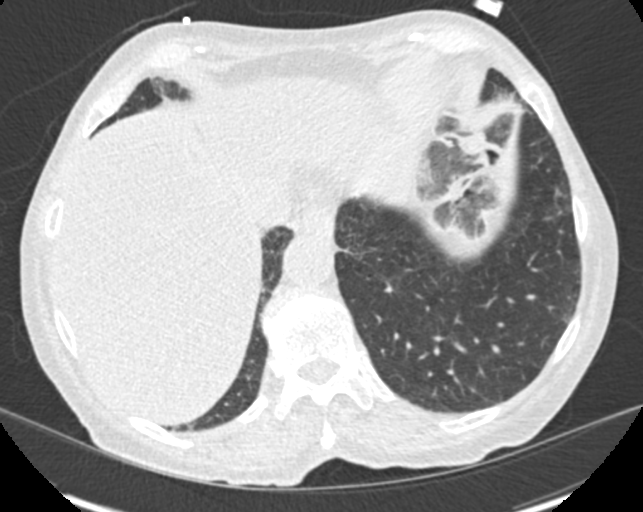
[im 27/80  vessel]
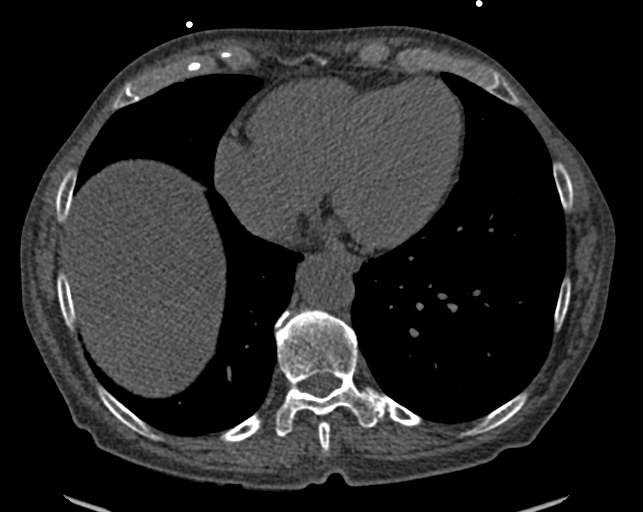
[im 40/80  vessel]
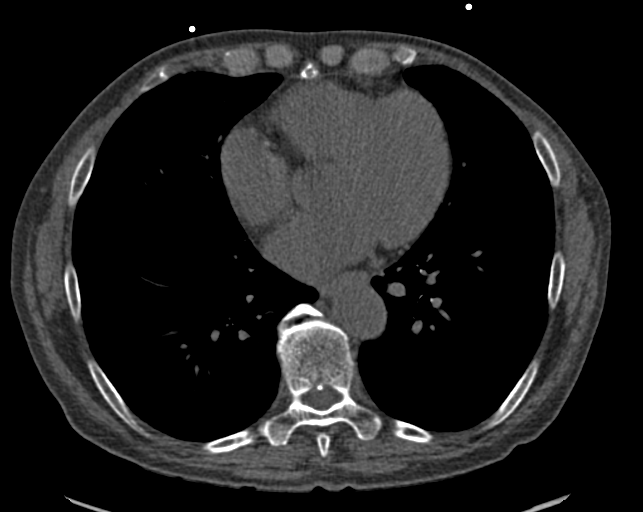
[im 53/80  vessel]
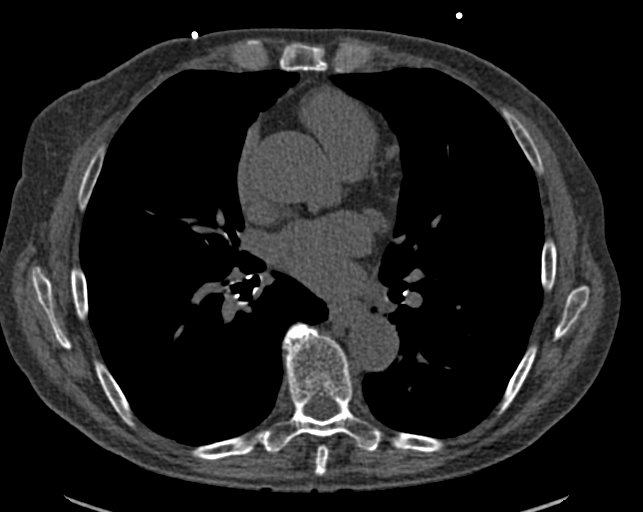
[im 66/80  vessel]
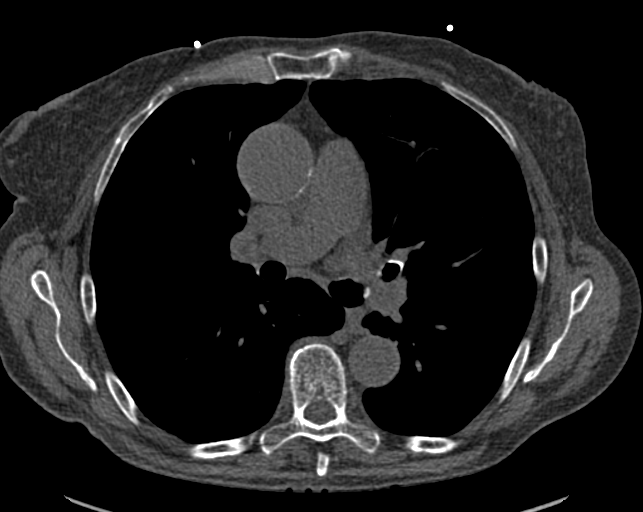
[im 66/80  lung]
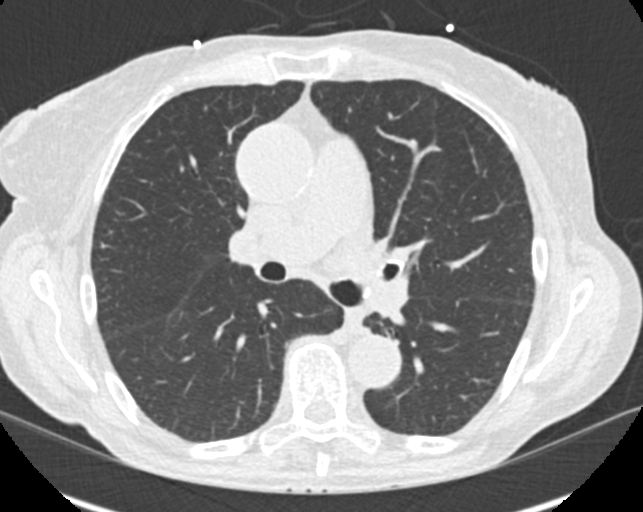

[Series 9: calcium scoring 2.00 br60 bestdiast 71% lungs · axial · 0.51mm/px · z∈[+1575,+1679]mm · 5 of 80 slices shown]
[im 14/80  vessel]
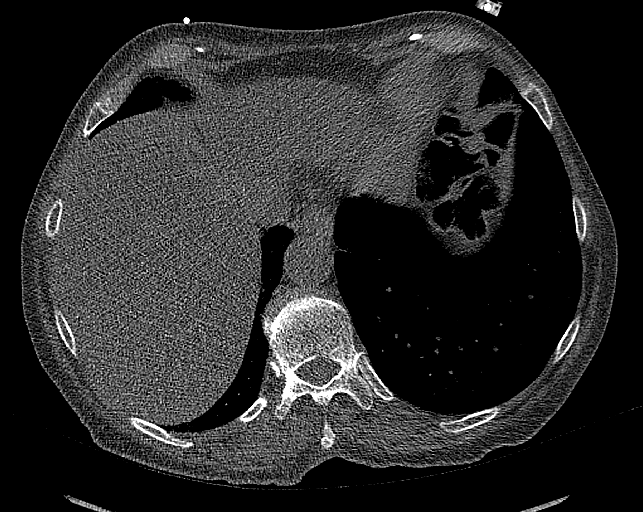
[im 27/80  vessel]
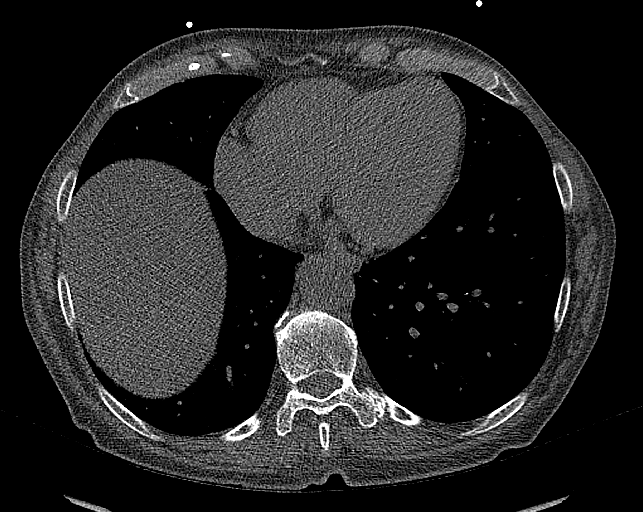
[im 40/80  vessel]
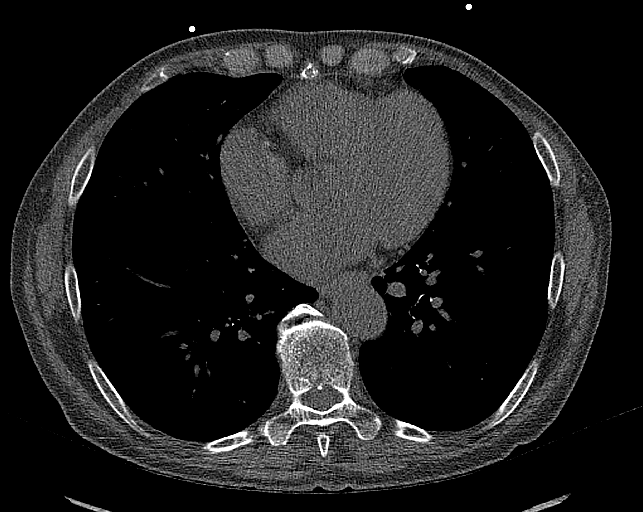
[im 53/80  vessel]
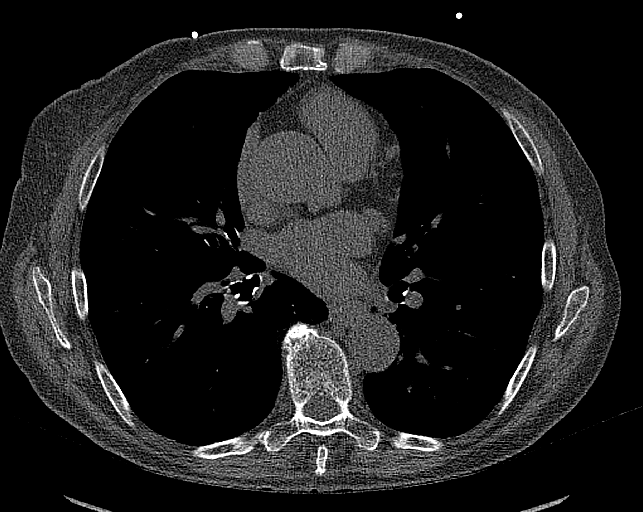
[im 66/80  vessel]
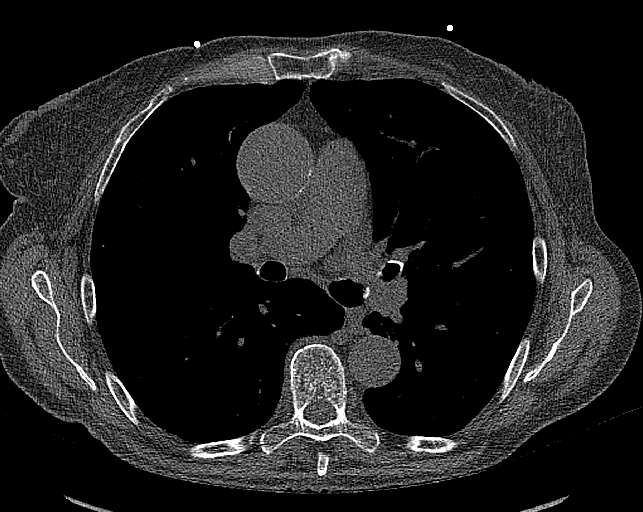

[14 of 20 positions shown; findings below may reference images not displayed]

FINDINGS: CORONARY CALCIUM SCORES:

Left Main: 0

LAD:

LCx: 0

RCA:

Total Agatston Score:

[HOSPITAL] percentile: 71

AORTA MEASUREMENTS:

Ascending Aorta: 38 mm

Descending Aorta: 25 mm

OTHER FINDINGS:

The heart size is within normal limits. The aortic valve is
partially calcified and the mitral valve/annulus is heavily
calcified. No pericardial fluid is identified. Visualized segments
of the thoracic aorta and central pulmonary arteries are normal in
caliber. Visualized mediastinum and hilar regions demonstrate no
lymphadenopathy or masses. Visualized lungs show no evidence of
pulmonary edema, consolidation, pneumothorax, nodule or pleural
fluid. Visualized liver demonstrates diffuse steatosis. There is
evidence of prior gastric bariatric surgery. Visualized bony
structures are unremarkable.
IMPRESSION: 1. Coronary calcium score of 40.9 is at the 71st percentile for the
patient's age, sex and race.
2. Calcified aortic valve and mitral valve/annulus with top-normal
caliber of the ascending thoracic aorta. Correlation with
echocardiography may be helpful.
3. Hepatic steatosis.

## 2023-02-14 ENCOUNTER — Encounter: Payer: Self-pay | Admitting: Urology

## 2023-02-14 ENCOUNTER — Ambulatory Visit: Payer: Medicare PPO | Admitting: Urology

## 2023-02-14 VITALS — BP 161/90 | HR 76 | Ht 65.0 in | Wt 151.0 lb

## 2023-02-14 DIAGNOSIS — N3941 Urge incontinence: Secondary | ICD-10-CM

## 2023-02-14 LAB — URINALYSIS, ROUTINE W REFLEX MICROSCOPIC
Bilirubin, UA: NEGATIVE
Glucose, UA: NEGATIVE
Ketones, UA: NEGATIVE
Nitrite, UA: NEGATIVE
Protein,UA: NEGATIVE
RBC, UA: NEGATIVE
Specific Gravity, UA: 1.015 (ref 1.005–1.030)
Urobilinogen, Ur: 0.2 mg/dL (ref 0.2–1.0)
pH, UA: 6 (ref 5.0–7.5)

## 2023-02-14 LAB — MICROSCOPIC EXAMINATION
Cast Type: NONE SEEN
Casts: NONE SEEN /lpf
Crystal Type: NONE SEEN
Crystals: NONE SEEN
Trichomonas, UA: NONE SEEN
Yeast, UA: NONE SEEN

## 2023-02-14 LAB — BLADDER SCAN AMB NON-IMAGING

## 2023-02-14 MED ORDER — SOLIFENACIN SUCCINATE 10 MG PO TABS: 10.0000 mg | ORAL_TABLET | Freq: Every day | ORAL | 3 refills | Status: DC

## 2023-02-14 NOTE — Progress Notes (Addendum)
Assessment: 1. Urge incontinence     Plan: Continue Vesicare 10 mg daily.  Refill sent. Return to office in 1 year.  Chief Complaint:  Chief Complaint  Patient presents with   Urinary Incontinence    History of Present Illness:  Hailey Espinoza is a 67 y.o. year old female who is seen for continued evaluation of urge incontinence.  She was previously followed in [facility] and was last seen there in May 2022. At her visit in 5/23, she continued on Vesicare 10 mg daily.  Her urinary symptoms were well controlled.  No dysuria or gross hematuria.  She returns today for scheduled follow-up.  She ran out of Vesicare approximately 1 month ago.  She has not refilled this due to concerns about reported side effects.  She has noted a recent increase in her urge incontinence since being out of the medication.  No dysuria or gross hematuria.  Urologic History: She has a history of intermittent urinary incontinence for a number of years. Her symptoms include urgency with urge incontinence. She is status post a TVT-O urethral sling in April 2016. Her postoperative course was complicated by urinary retention as well as a pelvic fracture. She had a prolonged recovery due to the pelvic fracture. MRI of the pelvis showed fractures at the lateral aspect of the right superior pubic ramus and at the mid right inferior pubic ramus. She is currently voiding spontaneously. She continues to have nocturia 2, and urgency. She also has urge incontinence 3-4 times/day, requiring 2 poise pads per day (previously 4). She denies any stress incontinence. She previously tried oxybutynin but was unable to tolerate this due to constipation.    Urodynamics from 10/20/15 showed a large capacity, hyposensitive, non-compliant bladder with unstable bladder contractions with associated leakage. Cystoscopy from 11/01/15 showed no bladder abnormalities. She was started on PTNS on 11/09/15. She completed the initial 12 treatments and  noted improvement in her nocturia, urgency, and urge incontinence. She also continued Myrbetriq 50 mg. She continued with the monthly PTNS maintenance treatments.  At the time of her visit in 2/18, her symptoms had worsened with increased urgency and urge incontinence, and she was wearing 1-2 mini pads/day. She was placed on Vesicare instead of the Myrbetriq.  She noted excellent control of her urinary symptoms with the Vesicare. No side effects. Her last PTNS treatment was in June 2018.   At her visit in 5/22, she continued to do well on the Vesicare with good control of her bladder symptoms. She was not having any significant problems with urinary incontinence. No side effects. No dysuria or gross hematuria.  Portions of the above documentation were copied from a prior visit for review purposes only.   Past Medical History:  Past Medical History:  Diagnosis Date   Arthritis    Blood transfusion without reported diagnosis    Diabetes mellitus without complication (HCC)    Hypertension    Ulcer     Past Surgical History:  No past surgical history on file.  Allergies:  No Known Allergies  Family History:  No family history on file.  Social History:  Social History   Tobacco Use   Smoking status: Never   Smokeless tobacco: Never  Substance Use Topics   Alcohol use: No   Drug use: No    ROS: Constitutional:  Negative for fever, chills, weight loss CV: Negative for chest pain, previous MI, hypertension Respiratory:  Negative for shortness of breath, wheezing, sleep apnea, frequent cough GI:  Negative for nausea, vomiting, bloody stool, GERD  Physical exam: BP (!) 161/90   Pulse 76   Ht 5\' 5"  (1.651 m)   Wt 151 lb (68.5 kg)   BMI 25.13 kg/m  GENERAL APPEARANCE:  Well appearing, well developed, well nourished, NAD HEENT:  Atraumatic, normocephalic, oropharynx clear NECK:  Supple without lymphadenopathy or thyromegaly ABDOMEN:  Soft, non-tender, no masses EXTREMITIES:   Moves all extremities well, without clubbing, cyanosis, or edema NEUROLOGIC:  Alert and oriented x 3, normal gait, CN II-XII grossly intact MENTAL STATUS:  appropriate BACK:  Non-tender to palpation, No CVAT SKIN:  Warm, dry, and intact  Results: U/A:  0-5 WBC, 0-2 RBC, mod bacteria  PVR = 4 ml

## 2023-02-15 DIAGNOSIS — R011 Cardiac murmur, unspecified: Secondary | ICD-10-CM | POA: Diagnosis not present

## 2023-02-15 DIAGNOSIS — R931 Abnormal findings on diagnostic imaging of heart and coronary circulation: Secondary | ICD-10-CM | POA: Diagnosis not present

## 2023-02-15 DIAGNOSIS — I10 Essential (primary) hypertension: Secondary | ICD-10-CM | POA: Diagnosis not present

## 2023-02-15 DIAGNOSIS — I4510 Unspecified right bundle-branch block: Secondary | ICD-10-CM | POA: Diagnosis not present

## 2023-02-15 DIAGNOSIS — Z8249 Family history of ischemic heart disease and other diseases of the circulatory system: Secondary | ICD-10-CM | POA: Diagnosis not present

## 2023-02-15 DIAGNOSIS — I358 Other nonrheumatic aortic valve disorders: Secondary | ICD-10-CM | POA: Diagnosis not present

## 2023-03-05 DIAGNOSIS — Z1231 Encounter for screening mammogram for malignant neoplasm of breast: Secondary | ICD-10-CM | POA: Diagnosis not present

## 2023-03-05 DIAGNOSIS — Z6825 Body mass index (BMI) 25.0-25.9, adult: Secondary | ICD-10-CM | POA: Diagnosis not present

## 2023-03-05 DIAGNOSIS — E2839 Other primary ovarian failure: Secondary | ICD-10-CM | POA: Diagnosis not present

## 2023-03-05 DIAGNOSIS — M47812 Spondylosis without myelopathy or radiculopathy, cervical region: Secondary | ICD-10-CM | POA: Diagnosis not present

## 2023-03-05 DIAGNOSIS — K219 Gastro-esophageal reflux disease without esophagitis: Secondary | ICD-10-CM | POA: Diagnosis not present

## 2023-03-05 DIAGNOSIS — E1169 Type 2 diabetes mellitus with other specified complication: Secondary | ICD-10-CM | POA: Diagnosis not present

## 2023-03-05 DIAGNOSIS — Z79899 Other long term (current) drug therapy: Secondary | ICD-10-CM | POA: Diagnosis not present

## 2023-03-05 DIAGNOSIS — I10 Essential (primary) hypertension: Secondary | ICD-10-CM | POA: Diagnosis not present

## 2023-03-16 DIAGNOSIS — M47896 Other spondylosis, lumbar region: Secondary | ICD-10-CM | POA: Diagnosis not present

## 2023-03-16 DIAGNOSIS — M419 Scoliosis, unspecified: Secondary | ICD-10-CM | POA: Diagnosis not present

## 2023-03-16 DIAGNOSIS — M791 Myalgia, unspecified site: Secondary | ICD-10-CM | POA: Diagnosis not present

## 2023-03-16 DIAGNOSIS — M47892 Other spondylosis, cervical region: Secondary | ICD-10-CM | POA: Diagnosis not present

## 2023-03-26 DIAGNOSIS — M47896 Other spondylosis, lumbar region: Secondary | ICD-10-CM | POA: Diagnosis not present

## 2023-03-26 DIAGNOSIS — M50322 Other cervical disc degeneration at C5-C6 level: Secondary | ICD-10-CM | POA: Diagnosis not present

## 2023-03-26 DIAGNOSIS — M5021 Other cervical disc displacement,  high cervical region: Secondary | ICD-10-CM | POA: Diagnosis not present

## 2023-03-26 DIAGNOSIS — M4802 Spinal stenosis, cervical region: Secondary | ICD-10-CM | POA: Diagnosis not present

## 2023-03-26 DIAGNOSIS — M50221 Other cervical disc displacement at C4-C5 level: Secondary | ICD-10-CM | POA: Diagnosis not present

## 2023-03-26 DIAGNOSIS — M47892 Other spondylosis, cervical region: Secondary | ICD-10-CM | POA: Diagnosis not present

## 2023-03-26 DIAGNOSIS — M542 Cervicalgia: Secondary | ICD-10-CM | POA: Diagnosis not present

## 2023-03-30 DIAGNOSIS — I358 Other nonrheumatic aortic valve disorders: Secondary | ICD-10-CM | POA: Diagnosis not present

## 2023-03-30 DIAGNOSIS — Z8249 Family history of ischemic heart disease and other diseases of the circulatory system: Secondary | ICD-10-CM | POA: Diagnosis not present

## 2023-04-06 DIAGNOSIS — E2839 Other primary ovarian failure: Secondary | ICD-10-CM | POA: Diagnosis not present

## 2023-04-06 DIAGNOSIS — M810 Age-related osteoporosis without current pathological fracture: Secondary | ICD-10-CM | POA: Diagnosis not present

## 2023-04-12 DIAGNOSIS — M47812 Spondylosis without myelopathy or radiculopathy, cervical region: Secondary | ICD-10-CM | POA: Diagnosis not present

## 2023-04-12 DIAGNOSIS — M810 Age-related osteoporosis without current pathological fracture: Secondary | ICD-10-CM | POA: Diagnosis not present

## 2023-04-12 DIAGNOSIS — M419 Scoliosis, unspecified: Secondary | ICD-10-CM | POA: Diagnosis not present

## 2023-04-12 DIAGNOSIS — Z6825 Body mass index (BMI) 25.0-25.9, adult: Secondary | ICD-10-CM | POA: Diagnosis not present

## 2023-04-18 DIAGNOSIS — M810 Age-related osteoporosis without current pathological fracture: Secondary | ICD-10-CM | POA: Diagnosis not present

## 2023-05-01 DIAGNOSIS — M47812 Spondylosis without myelopathy or radiculopathy, cervical region: Secondary | ICD-10-CM | POA: Diagnosis not present

## 2023-05-04 DIAGNOSIS — R600 Localized edema: Secondary | ICD-10-CM | POA: Diagnosis not present

## 2023-05-28 DIAGNOSIS — R197 Diarrhea, unspecified: Secondary | ICD-10-CM | POA: Diagnosis not present

## 2023-05-28 DIAGNOSIS — K5904 Chronic idiopathic constipation: Secondary | ICD-10-CM | POA: Diagnosis not present

## 2023-06-25 DIAGNOSIS — M47812 Spondylosis without myelopathy or radiculopathy, cervical region: Secondary | ICD-10-CM | POA: Diagnosis not present

## 2023-07-02 DIAGNOSIS — Z1231 Encounter for screening mammogram for malignant neoplasm of breast: Secondary | ICD-10-CM | POA: Diagnosis not present

## 2023-07-18 DIAGNOSIS — R195 Other fecal abnormalities: Secondary | ICD-10-CM | POA: Diagnosis not present

## 2023-07-18 DIAGNOSIS — K5904 Chronic idiopathic constipation: Secondary | ICD-10-CM | POA: Diagnosis not present

## 2023-08-27 DIAGNOSIS — Z1331 Encounter for screening for depression: Secondary | ICD-10-CM | POA: Diagnosis not present

## 2023-08-27 DIAGNOSIS — Z79899 Other long term (current) drug therapy: Secondary | ICD-10-CM | POA: Diagnosis not present

## 2023-08-27 DIAGNOSIS — M19049 Primary osteoarthritis, unspecified hand: Secondary | ICD-10-CM | POA: Diagnosis not present

## 2023-08-27 DIAGNOSIS — E1169 Type 2 diabetes mellitus with other specified complication: Secondary | ICD-10-CM | POA: Diagnosis not present

## 2023-08-27 DIAGNOSIS — Z0000 Encounter for general adult medical examination without abnormal findings: Secondary | ICD-10-CM | POA: Diagnosis not present

## 2023-08-27 DIAGNOSIS — K219 Gastro-esophageal reflux disease without esophagitis: Secondary | ICD-10-CM | POA: Diagnosis not present

## 2023-08-27 DIAGNOSIS — I10 Essential (primary) hypertension: Secondary | ICD-10-CM | POA: Diagnosis not present

## 2023-08-27 DIAGNOSIS — Z6825 Body mass index (BMI) 25.0-25.9, adult: Secondary | ICD-10-CM | POA: Diagnosis not present

## 2023-08-29 DIAGNOSIS — M47812 Spondylosis without myelopathy or radiculopathy, cervical region: Secondary | ICD-10-CM | POA: Diagnosis not present

## 2023-09-03 DIAGNOSIS — H906 Mixed conductive and sensorineural hearing loss, bilateral: Secondary | ICD-10-CM | POA: Diagnosis not present

## 2023-09-03 DIAGNOSIS — H6993 Unspecified Eustachian tube disorder, bilateral: Secondary | ICD-10-CM | POA: Diagnosis not present

## 2023-09-20 DIAGNOSIS — M79642 Pain in left hand: Secondary | ICD-10-CM | POA: Diagnosis not present

## 2023-09-20 DIAGNOSIS — M79641 Pain in right hand: Secondary | ICD-10-CM | POA: Diagnosis not present

## 2023-09-25 DIAGNOSIS — I10 Essential (primary) hypertension: Secondary | ICD-10-CM | POA: Diagnosis not present

## 2023-09-25 DIAGNOSIS — M47812 Spondylosis without myelopathy or radiculopathy, cervical region: Secondary | ICD-10-CM | POA: Diagnosis not present

## 2023-09-25 DIAGNOSIS — Z6827 Body mass index (BMI) 27.0-27.9, adult: Secondary | ICD-10-CM | POA: Diagnosis not present

## 2023-10-01 DIAGNOSIS — D72829 Elevated white blood cell count, unspecified: Secondary | ICD-10-CM | POA: Diagnosis not present

## 2023-10-02 DIAGNOSIS — H43392 Other vitreous opacities, left eye: Secondary | ICD-10-CM | POA: Diagnosis not present

## 2023-10-02 DIAGNOSIS — E119 Type 2 diabetes mellitus without complications: Secondary | ICD-10-CM | POA: Diagnosis not present

## 2023-10-10 DIAGNOSIS — M47812 Spondylosis without myelopathy or radiculopathy, cervical region: Secondary | ICD-10-CM | POA: Diagnosis not present

## 2023-10-16 DIAGNOSIS — H65493 Other chronic nonsuppurative otitis media, bilateral: Secondary | ICD-10-CM | POA: Diagnosis not present

## 2023-10-16 DIAGNOSIS — H906 Mixed conductive and sensorineural hearing loss, bilateral: Secondary | ICD-10-CM | POA: Diagnosis not present

## 2023-10-18 DIAGNOSIS — H9393 Unspecified disorder of ear, bilateral: Secondary | ICD-10-CM | POA: Diagnosis not present

## 2023-10-18 DIAGNOSIS — M79641 Pain in right hand: Secondary | ICD-10-CM | POA: Diagnosis not present

## 2023-10-18 DIAGNOSIS — H73892 Other specified disorders of tympanic membrane, left ear: Secondary | ICD-10-CM | POA: Diagnosis not present

## 2023-11-27 DIAGNOSIS — M47812 Spondylosis without myelopathy or radiculopathy, cervical region: Secondary | ICD-10-CM | POA: Diagnosis not present

## 2023-12-11 DIAGNOSIS — H906 Mixed conductive and sensorineural hearing loss, bilateral: Secondary | ICD-10-CM | POA: Diagnosis not present

## 2023-12-25 DIAGNOSIS — L900 Lichen sclerosus et atrophicus: Secondary | ICD-10-CM | POA: Diagnosis not present

## 2023-12-25 DIAGNOSIS — Z1330 Encounter for screening examination for mental health and behavioral disorders, unspecified: Secondary | ICD-10-CM | POA: Diagnosis not present

## 2023-12-25 DIAGNOSIS — Z01411 Encounter for gynecological examination (general) (routine) with abnormal findings: Secondary | ICD-10-CM | POA: Diagnosis not present

## 2023-12-26 DIAGNOSIS — M47812 Spondylosis without myelopathy or radiculopathy, cervical region: Secondary | ICD-10-CM | POA: Diagnosis not present

## 2023-12-31 DIAGNOSIS — H6692 Otitis media, unspecified, left ear: Secondary | ICD-10-CM | POA: Diagnosis not present

## 2023-12-31 DIAGNOSIS — H9393 Unspecified disorder of ear, bilateral: Secondary | ICD-10-CM | POA: Diagnosis not present

## 2023-12-31 DIAGNOSIS — H906 Mixed conductive and sensorineural hearing loss, bilateral: Secondary | ICD-10-CM | POA: Diagnosis not present

## 2023-12-31 DIAGNOSIS — H9012 Conductive hearing loss, unilateral, left ear, with unrestricted hearing on the contralateral side: Secondary | ICD-10-CM | POA: Diagnosis not present

## 2024-01-16 DIAGNOSIS — K5904 Chronic idiopathic constipation: Secondary | ICD-10-CM | POA: Diagnosis not present

## 2024-01-16 DIAGNOSIS — R198 Other specified symptoms and signs involving the digestive system and abdomen: Secondary | ICD-10-CM | POA: Diagnosis not present

## 2024-01-23 DIAGNOSIS — M47812 Spondylosis without myelopathy or radiculopathy, cervical region: Secondary | ICD-10-CM | POA: Diagnosis not present

## 2024-02-14 ENCOUNTER — Encounter: Payer: Self-pay | Admitting: Urology

## 2024-02-14 ENCOUNTER — Ambulatory Visit: Payer: Medicare PPO | Admitting: Urology

## 2024-02-14 VITALS — BP 133/83 | HR 60 | Ht 64.0 in | Wt 152.0 lb

## 2024-02-14 DIAGNOSIS — N3941 Urge incontinence: Secondary | ICD-10-CM

## 2024-02-14 LAB — URINALYSIS, ROUTINE W REFLEX MICROSCOPIC
Bilirubin, UA: NEGATIVE
Glucose, UA: NEGATIVE
Ketones, UA: NEGATIVE
Leukocytes,UA: NEGATIVE
Nitrite, UA: NEGATIVE
Protein,UA: NEGATIVE
RBC, UA: NEGATIVE
Specific Gravity, UA: 1.01 (ref 1.005–1.030)
Urobilinogen, Ur: 0.2 mg/dL (ref 0.2–1.0)
pH, UA: 5 (ref 5.0–7.5)

## 2024-02-14 MED ORDER — SOLIFENACIN SUCCINATE 10 MG PO TABS: 10.0000 mg | ORAL_TABLET | Freq: Every day | ORAL | 3 refills | Status: DC

## 2024-02-14 NOTE — Progress Notes (Signed)
 Assessment: 1. Urge incontinence     Plan: Continue Vesicare  10 mg daily.  Refill sent. Return to office in 1 year.  Chief Complaint:  Chief Complaint  Patient presents with   Urinary Incontinence    History of Present Illness:  Hailey Espinoza is a 68 y.o. year old female who is seen for continued evaluation of urge incontinence.  She was previously followed in [facility] and was last seen there in May 2022. At her visit in 5/23, she continued on Vesicare  10 mg daily.  Her urinary symptoms were well controlled.  No dysuria or gross hematuria.  At the time of her visit in 5/24 she had run out of Vesicare  approximately 1 month prior.  She had not refilled this due to concerns about reported side effects.  She noted a recent increase in her urge incontinence since being out of the medication.  No dysuria or gross hematuria. She was restarted on Vesicare  10 mg daily.  She returns today for annual follow-up.  She continues on solifenacin  10 mg.  She is taking the medication every other day to every 2 days.  She reports this works well for her.  She continues to have good control of her symptoms.  She does have occasional urge incontinence.  No dysuria or gross hematuria.  Urologic History: She has a history of intermittent urinary incontinence for a number of years. Her symptoms include urgency with urge incontinence. She is status post a TVT-O urethral sling in April 2016. Her postoperative course was complicated by urinary retention as well as a pelvic fracture. She had a prolonged recovery due to the pelvic fracture. MRI of the pelvis showed fractures at the lateral aspect of the right superior pubic ramus and at the mid right inferior pubic ramus. She is currently voiding spontaneously. She continues to have nocturia 2, and urgency. She also has urge incontinence 3-4 times/day, requiring 2 poise pads per day (previously 4). She denies any stress incontinence. She previously tried  oxybutynin but was unable to tolerate this due to constipation.    Urodynamics from 10/20/15 showed a large capacity, hyposensitive, non-compliant bladder with unstable bladder contractions with associated leakage. Cystoscopy from 11/01/15 showed no bladder abnormalities. She was started on PTNS on 11/09/15. She completed the initial 12 treatments and noted improvement in her nocturia, urgency, and urge incontinence. She also continued Myrbetriq 50 mg. She continued with the monthly PTNS maintenance treatments.  At the time of her visit in 2/18, her symptoms had worsened with increased urgency and urge incontinence, and she was wearing 1-2 mini pads/day. She was placed on Vesicare  instead of the Myrbetriq.  She noted excellent control of her urinary symptoms with the Vesicare . No side effects. Her last PTNS treatment was in June 2018.   At her visit in 5/22, she continued to do well on the Vesicare  with good control of her bladder symptoms. She was not having any significant problems with urinary incontinence. No side effects. No dysuria or gross hematuria.  Portions of the above documentation were copied from a prior visit for review purposes only.   Past Medical History:  Past Medical History:  Diagnosis Date   Arthritis    Blood transfusion without reported diagnosis    Diabetes mellitus without complication (HCC)    Hypertension    Ulcer     Past Surgical History:  No past surgical history on file.  Allergies:  No Known Allergies  Family History:  No family history on file.  Social History:  Social History   Tobacco Use   Smoking status: Never   Smokeless tobacco: Never  Substance Use Topics   Alcohol use: No   Drug use: No    ROS: Constitutional:  Negative for fever, chills, weight loss CV: Negative for chest pain, previous MI, hypertension Respiratory:  Negative for shortness of breath, wheezing, sleep apnea, frequent cough GI:  Negative for nausea, vomiting, bloody  stool, GERD  Physical exam: BP 133/83   Pulse 60   Ht 5\' 4"  (1.626 m)   Wt 152 lb (68.9 kg)   BMI 26.09 kg/m  GENERAL APPEARANCE:  Well appearing, well developed, well nourished, NAD HEENT:  Atraumatic, normocephalic, oropharynx clear NECK:  Supple without lymphadenopathy or thyromegaly ABDOMEN:  Soft, non-tender, no masses EXTREMITIES:  Moves all extremities well, without clubbing, cyanosis, or edema NEUROLOGIC:  Alert and oriented x 3, normal gait, CN II-XII grossly intact MENTAL STATUS:  appropriate BACK:  Non-tender to palpation, No CVAT SKIN:  Warm, dry, and intact  Results: U/A: Negative

## 2024-02-15 DIAGNOSIS — I358 Other nonrheumatic aortic valve disorders: Secondary | ICD-10-CM | POA: Diagnosis not present

## 2024-02-15 DIAGNOSIS — I10 Essential (primary) hypertension: Secondary | ICD-10-CM | POA: Diagnosis not present

## 2024-02-15 DIAGNOSIS — R931 Abnormal findings on diagnostic imaging of heart and coronary circulation: Secondary | ICD-10-CM | POA: Diagnosis not present

## 2024-02-15 DIAGNOSIS — Z8249 Family history of ischemic heart disease and other diseases of the circulatory system: Secondary | ICD-10-CM | POA: Diagnosis not present

## 2024-02-25 DIAGNOSIS — I10 Essential (primary) hypertension: Secondary | ICD-10-CM | POA: Diagnosis not present

## 2024-02-25 DIAGNOSIS — Z6825 Body mass index (BMI) 25.0-25.9, adult: Secondary | ICD-10-CM | POA: Diagnosis not present

## 2024-02-25 DIAGNOSIS — Z79899 Other long term (current) drug therapy: Secondary | ICD-10-CM | POA: Diagnosis not present

## 2024-02-25 DIAGNOSIS — M47812 Spondylosis without myelopathy or radiculopathy, cervical region: Secondary | ICD-10-CM | POA: Diagnosis not present

## 2024-02-25 DIAGNOSIS — L309 Dermatitis, unspecified: Secondary | ICD-10-CM | POA: Diagnosis not present

## 2024-02-25 DIAGNOSIS — M19049 Primary osteoarthritis, unspecified hand: Secondary | ICD-10-CM | POA: Diagnosis not present

## 2024-02-25 DIAGNOSIS — K219 Gastro-esophageal reflux disease without esophagitis: Secondary | ICD-10-CM | POA: Diagnosis not present

## 2024-02-25 DIAGNOSIS — E1169 Type 2 diabetes mellitus with other specified complication: Secondary | ICD-10-CM | POA: Diagnosis not present

## 2024-02-25 DIAGNOSIS — Z1231 Encounter for screening mammogram for malignant neoplasm of breast: Secondary | ICD-10-CM | POA: Diagnosis not present

## 2024-04-29 DIAGNOSIS — H906 Mixed conductive and sensorineural hearing loss, bilateral: Secondary | ICD-10-CM | POA: Diagnosis not present

## 2024-04-29 DIAGNOSIS — H65493 Other chronic nonsuppurative otitis media, bilateral: Secondary | ICD-10-CM | POA: Diagnosis not present

## 2024-07-07 DIAGNOSIS — Z1231 Encounter for screening mammogram for malignant neoplasm of breast: Secondary | ICD-10-CM | POA: Diagnosis not present

## 2024-07-08 DIAGNOSIS — Z1231 Encounter for screening mammogram for malignant neoplasm of breast: Secondary | ICD-10-CM | POA: Diagnosis not present

## 2024-08-02 ENCOUNTER — Encounter: Payer: Self-pay | Admitting: Urology

## 2024-08-04 ENCOUNTER — Other Ambulatory Visit: Payer: Self-pay | Admitting: Urology

## 2024-08-04 DIAGNOSIS — N3941 Urge incontinence: Secondary | ICD-10-CM

## 2024-08-04 MED ORDER — DARIFENACIN HYDROBROMIDE ER 15 MG PO TB24
15.0000 mg | ORAL_TABLET | Freq: Every day | ORAL | 11 refills | Status: AC
Start: 2024-08-04 — End: ?

## 2025-02-12 ENCOUNTER — Ambulatory Visit: Admitting: Urology
